# Patient Record
Sex: Male | Born: 1990 | Race: White | Hispanic: No | Marital: Single
Health system: Southern US, Community
[De-identification: ages and names within clinical notes are randomized; demographics above are authoritative.]

## PROBLEM LIST (undated history)

## (undated) DIAGNOSIS — C6290 Malignant neoplasm of unspecified testis, unspecified whether descended or undescended: Secondary | ICD-10-CM

## (undated) HISTORY — DX: Malignant neoplasm of unspecified testis, unspecified whether descended or undescended: C62.90

---
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
## 2008-03-03 ENCOUNTER — Ambulatory Visit (HOSPITAL_COMMUNITY): Admission: RE | Admit: 2008-03-03 | Discharge: 2008-03-03 | Payer: Self-pay | Admitting: Orthopedic Surgery

## 2009-03-06 ENCOUNTER — Ambulatory Visit (HOSPITAL_COMMUNITY): Admission: RE | Admit: 2009-03-06 | Discharge: 2009-03-06 | Payer: Self-pay | Admitting: Family Medicine

## 2009-03-13 ENCOUNTER — Encounter (INDEPENDENT_AMBULATORY_CARE_PROVIDER_SITE_OTHER): Payer: Self-pay | Admitting: Urology

## 2009-03-13 ENCOUNTER — Ambulatory Visit (HOSPITAL_BASED_OUTPATIENT_CLINIC_OR_DEPARTMENT_OTHER): Admission: RE | Admit: 2009-03-13 | Discharge: 2009-03-13 | Payer: Self-pay | Admitting: Urology

## 2009-03-16 ENCOUNTER — Emergency Department (HOSPITAL_BASED_OUTPATIENT_CLINIC_OR_DEPARTMENT_OTHER): Admission: EM | Admit: 2009-03-16 | Discharge: 2009-03-16 | Payer: Self-pay | Admitting: Emergency Medicine

## 2009-03-16 ENCOUNTER — Ambulatory Visit: Payer: Self-pay | Admitting: Hematology & Oncology

## 2009-03-21 ENCOUNTER — Ambulatory Visit (HOSPITAL_COMMUNITY): Admission: RE | Admit: 2009-03-21 | Discharge: 2009-03-21 | Payer: Self-pay | Admitting: Hematology & Oncology

## 2009-03-31 LAB — CBC WITH DIFFERENTIAL (CANCER CENTER ONLY)
BASO%: 0.7 % (ref 0.0–2.0)
HCT: 43.1 % (ref 38.7–49.9)
LYMPH#: 1.5 10*3/uL (ref 0.9–3.3)
MONO#: 0.3 10*3/uL (ref 0.1–0.9)
NEUT#: 3.1 10*3/uL (ref 1.5–6.5)
Platelets: 260 10*3/uL (ref 145–400)
RDW: 11.7 % (ref 10.5–14.6)
WBC: 5.1 10*3/uL (ref 4.0–10.0)

## 2009-04-02 LAB — COMPREHENSIVE METABOLIC PANEL
AST: 19 U/L (ref 0–37)
Albumin: 4.5 g/dL (ref 3.5–5.2)
Alkaline Phosphatase: 54 U/L (ref 39–117)
BUN: 11 mg/dL (ref 6–23)
Creatinine, Ser: 0.9 mg/dL (ref 0.40–1.50)
Potassium: 4.6 mEq/L (ref 3.5–5.3)
Total Bilirubin: 0.7 mg/dL (ref 0.3–1.2)

## 2009-05-02 ENCOUNTER — Ambulatory Visit: Payer: Self-pay | Admitting: Hematology & Oncology

## 2009-05-07 LAB — BETA HCG QUANT (REF LAB): Beta hCG, Tumor Marker: 62.1 m[IU]/mL — ABNORMAL HIGH (ref <5.0)

## 2009-05-10 ENCOUNTER — Ambulatory Visit: Payer: Self-pay | Admitting: Radiology

## 2009-05-10 ENCOUNTER — Ambulatory Visit (HOSPITAL_BASED_OUTPATIENT_CLINIC_OR_DEPARTMENT_OTHER): Admission: RE | Admit: 2009-05-10 | Discharge: 2009-05-10 | Payer: Self-pay | Admitting: Hematology & Oncology

## 2009-06-02 ENCOUNTER — Ambulatory Visit: Payer: Self-pay | Admitting: Hematology & Oncology

## 2009-06-05 LAB — CBC WITH DIFFERENTIAL (CANCER CENTER ONLY)
BASO%: 1.3 % (ref 0.0–2.0)
Eosinophils Absolute: 0.1 10*3/uL (ref 0.0–0.5)
LYMPH%: 16.9 % (ref 14.0–48.0)
MCH: 32.5 pg (ref 28.0–33.4)
MCV: 92 fL (ref 82–98)
MONO%: 9 % (ref 0.0–13.0)
Platelets: 156 10*3/uL (ref 145–400)
RDW: 12 % (ref 10.5–14.6)

## 2009-06-07 LAB — AFP TUMOR MARKER: AFP-Tumor Marker: 128.1 ng/mL — ABNORMAL HIGH (ref 0.0–8.0)

## 2009-06-07 LAB — COMPREHENSIVE METABOLIC PANEL
CO2: 24 mEq/L (ref 19–32)
Creatinine, Ser: 0.77 mg/dL (ref 0.40–1.50)
Glucose, Bld: 110 mg/dL — ABNORMAL HIGH (ref 70–99)
Total Bilirubin: 0.6 mg/dL (ref 0.3–1.2)

## 2009-06-07 LAB — BETA HCG QUANT (REF LAB): Beta hCG, Tumor Marker: 2.7 m[IU]/mL (ref <5.0)

## 2009-06-26 LAB — CBC WITH DIFFERENTIAL (CANCER CENTER ONLY)
BASO%: 1.2 % (ref 0.0–2.0)
EOS%: 0.9 % (ref 0.0–7.0)
LYMPH%: 24.6 % (ref 14.0–48.0)
MCV: 92 fL (ref 82–98)
MONO#: 0.8 10*3/uL (ref 0.1–0.9)
Platelets: 153 10*3/uL (ref 145–400)
RDW: 12.7 % (ref 10.5–14.6)
WBC: 10.5 10*3/uL — ABNORMAL HIGH (ref 4.0–10.0)

## 2009-06-28 LAB — COMPREHENSIVE METABOLIC PANEL
ALT: 30 U/L (ref 0–53)
AST: 20 U/L (ref 0–37)
Alkaline Phosphatase: 65 U/L (ref 39–117)
BUN: 11 mg/dL (ref 6–23)
Calcium: 9.6 mg/dL (ref 8.4–10.5)
Chloride: 107 mEq/L (ref 96–112)
Creatinine, Ser: 0.74 mg/dL (ref 0.40–1.50)
Total Bilirubin: 0.5 mg/dL (ref 0.3–1.2)

## 2009-06-28 LAB — BETA HCG QUANT (REF LAB): Beta hCG, Tumor Marker: <0.5 m[IU]/mL (ref <5.0)

## 2009-06-28 LAB — AFP TUMOR MARKER: AFP-Tumor Marker: 8.2 ng/mL — ABNORMAL HIGH (ref 0.0–8.0)

## 2009-06-28 LAB — MAGNESIUM: Magnesium: 1.8 mg/dL (ref 1.5–2.5)

## 2009-07-14 ENCOUNTER — Ambulatory Visit: Payer: Self-pay | Admitting: Hematology & Oncology

## 2009-07-17 LAB — COMPREHENSIVE METABOLIC PANEL
ALT: 21 U/L (ref 0–53)
AST: 21 U/L (ref 0–37)
Alkaline Phosphatase: 59 U/L (ref 39–117)
Chloride: 106 mEq/L (ref 96–112)
Creatinine, Ser: 0.78 mg/dL (ref 0.40–1.50)
Total Bilirubin: 0.5 mg/dL (ref 0.3–1.2)

## 2009-07-17 LAB — CBC WITH DIFFERENTIAL (CANCER CENTER ONLY)
BASO#: 0 10*3/uL (ref 0.0–0.2)
Eosinophils Absolute: 0.1 10*3/uL (ref 0.0–0.5)
HGB: 13.3 g/dL (ref 13.0–17.1)
LYMPH%: 30.4 % (ref 14.0–48.0)
MCH: 32.6 pg (ref 28.0–33.4)
MCV: 93 fL (ref 82–98)
MONO%: 7.5 % (ref 0.0–13.0)
NEUT%: 60.6 % (ref 40.0–80.0)
RBC: 4.07 10*6/uL — ABNORMAL LOW (ref 4.20–5.70)

## 2009-07-17 LAB — LACTATE DEHYDROGENASE: LDH: 193 U/L (ref 94–250)

## 2009-07-19 LAB — AFP TUMOR MARKER: AFP-Tumor Marker: 2 ng/mL (ref 0.0–8.0)

## 2009-07-19 LAB — BETA HCG QUANT (REF LAB): Beta hCG, Tumor Marker: <0.5 m[IU]/mL

## 2009-08-04 ENCOUNTER — Ambulatory Visit (HOSPITAL_BASED_OUTPATIENT_CLINIC_OR_DEPARTMENT_OTHER): Admission: RE | Admit: 2009-08-04 | Discharge: 2009-08-04 | Payer: Self-pay | Admitting: Hematology & Oncology

## 2009-08-04 ENCOUNTER — Ambulatory Visit: Payer: Self-pay | Admitting: Diagnostic Radiology

## 2009-08-21 ENCOUNTER — Ambulatory Visit: Payer: Self-pay | Admitting: Hematology & Oncology

## 2009-08-30 LAB — CBC WITH DIFFERENTIAL (CANCER CENTER ONLY)
Eosinophils Absolute: 0.1 10*3/uL (ref 0.0–0.5)
LYMPH#: 1.3 10*3/uL (ref 0.9–3.3)
LYMPH%: 33.8 % (ref 14.0–48.0)
MCV: 99 fL — ABNORMAL HIGH (ref 82–98)
MONO#: 0.2 10*3/uL (ref 0.1–0.9)
Platelets: 136 10*3/uL — ABNORMAL LOW (ref 145–400)
RBC: 4.17 10*6/uL — ABNORMAL LOW (ref 4.20–5.70)
WBC: 3.9 10*3/uL — ABNORMAL LOW (ref 4.0–10.0)

## 2009-09-04 LAB — COMPREHENSIVE METABOLIC PANEL
Albumin: 4.4 g/dL (ref 3.5–5.2)
CO2: 27 mEq/L (ref 19–32)
Calcium: 9.4 mg/dL (ref 8.4–10.5)
Chloride: 104 mEq/L (ref 96–112)
Glucose, Bld: 116 mg/dL — ABNORMAL HIGH (ref 70–99)
Potassium: 4.1 mEq/L (ref 3.5–5.3)
Sodium: 141 mEq/L (ref 135–145)
Total Bilirubin: 0.9 mg/dL (ref 0.3–1.2)
Total Protein: 6.6 g/dL (ref 6.0–8.3)

## 2009-09-04 LAB — AFP TUMOR MARKER: AFP-Tumor Marker: 1.5 ng/mL (ref 0.0–8.0)

## 2009-09-04 LAB — LACTATE DEHYDROGENASE: LDH: 125 U/L (ref 94–250)

## 2009-09-22 ENCOUNTER — Ambulatory Visit: Payer: Self-pay | Admitting: Diagnostic Radiology

## 2009-09-22 ENCOUNTER — Emergency Department (HOSPITAL_BASED_OUTPATIENT_CLINIC_OR_DEPARTMENT_OTHER): Admission: EM | Admit: 2009-09-22 | Discharge: 2009-09-22 | Payer: Self-pay | Admitting: Emergency Medicine

## 2009-10-30 ENCOUNTER — Ambulatory Visit: Payer: Self-pay | Admitting: Hematology & Oncology

## 2009-11-01 LAB — CBC WITH DIFFERENTIAL (CANCER CENTER ONLY)
BASO%: 0.6 % (ref 0.0–2.0)
EOS%: 4.2 % (ref 0.0–7.0)
HCT: 44.5 % (ref 38.7–49.9)
LYMPH%: 40.4 % (ref 14.0–48.0)
MCHC: 33 g/dL (ref 32.0–35.9)
MCV: 92 fL (ref 82–98)
NEUT%: 47.2 % (ref 40.0–80.0)
RDW: 11.5 % (ref 10.5–14.6)

## 2009-11-04 LAB — LACTATE DEHYDROGENASE: LDH: 162 U/L (ref 94–250)

## 2009-11-04 LAB — COMPREHENSIVE METABOLIC PANEL
ALT: 24 U/L (ref 0–53)
AST: 18 U/L (ref 0–37)
Creatinine, Ser: 0.74 mg/dL (ref 0.40–1.50)
Total Bilirubin: 0.7 mg/dL (ref 0.3–1.2)

## 2009-11-08 ENCOUNTER — Emergency Department (HOSPITAL_BASED_OUTPATIENT_CLINIC_OR_DEPARTMENT_OTHER): Admission: EM | Admit: 2009-11-08 | Discharge: 2009-11-08 | Payer: Self-pay | Admitting: Emergency Medicine

## 2010-01-08 ENCOUNTER — Ambulatory Visit: Payer: Self-pay | Admitting: Hematology & Oncology

## 2010-01-31 ENCOUNTER — Ambulatory Visit (HOSPITAL_BASED_OUTPATIENT_CLINIC_OR_DEPARTMENT_OTHER): Admission: RE | Admit: 2010-01-31 | Discharge: 2010-01-31 | Payer: Self-pay | Admitting: Hematology & Oncology

## 2010-01-31 ENCOUNTER — Ambulatory Visit: Payer: Self-pay | Admitting: Diagnostic Radiology

## 2010-01-31 LAB — CMP (CANCER CENTER ONLY)
ALT(SGPT): 20 U/L (ref 10–47)
AST: 20 U/L (ref 11–38)
Calcium: 9.8 mg/dL (ref 8.0–10.3)
Chloride: 98 mEq/L (ref 98–108)
Creat: 0.7 mg/dl (ref 0.6–1.2)
Sodium: 137 mEq/L (ref 128–145)

## 2010-01-31 LAB — CBC WITH DIFFERENTIAL (CANCER CENTER ONLY)
BASO%: 0.8 % (ref 0.0–2.0)
EOS%: 2.8 % (ref 0.0–7.0)
HCT: 46.3 % (ref 38.7–49.9)
LYMPH#: 1.2 10*3/uL (ref 0.9–3.3)
LYMPH%: 33 % (ref 14.0–48.0)
MCHC: 34.1 g/dL (ref 32.0–35.9)
MCV: 92 fL (ref 82–98)
NEUT%: 57.4 % (ref 40.0–80.0)
RDW: 12.3 % (ref 10.5–14.6)

## 2010-02-07 ENCOUNTER — Ambulatory Visit: Payer: Self-pay | Admitting: Hematology & Oncology

## 2010-05-02 ENCOUNTER — Ambulatory Visit: Payer: Self-pay | Admitting: Hematology & Oncology

## 2010-05-04 ENCOUNTER — Ambulatory Visit (HOSPITAL_BASED_OUTPATIENT_CLINIC_OR_DEPARTMENT_OTHER)
Admission: RE | Admit: 2010-05-04 | Discharge: 2010-05-04 | Payer: Self-pay | Source: Home / Self Care | Admitting: Hematology & Oncology

## 2010-05-04 ENCOUNTER — Ambulatory Visit: Payer: Self-pay | Admitting: Diagnostic Radiology

## 2010-05-04 LAB — CMP (CANCER CENTER ONLY)
AST: 27 U/L (ref 11–38)
Albumin: 4.5 g/dL (ref 3.3–5.5)
BUN, Bld: 12 mg/dL (ref 7–22)
CO2: 31 mEq/L (ref 18–33)
Calcium: 9.5 mg/dL (ref 8.0–10.3)
Chloride: 100 mEq/L (ref 98–108)
Glucose, Bld: 101 mg/dL (ref 73–118)
Potassium: 4.9 mEq/L — ABNORMAL HIGH (ref 3.3–4.7)

## 2010-05-04 LAB — CBC WITH DIFFERENTIAL (CANCER CENTER ONLY)
BASO#: 0 10*3/uL (ref 0.0–0.2)
Eosinophils Absolute: 0.1 10*3/uL (ref 0.0–0.5)
HGB: 15.5 g/dL (ref 13.0–17.1)
LYMPH%: 36.4 % (ref 14.0–48.0)
MCH: 31.6 pg (ref 28.0–33.4)
MCHC: 33.6 g/dL (ref 32.0–35.9)
MCV: 94 fL (ref 82–98)
MONO%: 6.7 % (ref 0.0–13.0)
NEUT%: 53.6 % (ref 40.0–80.0)
RBC: 4.92 10*6/uL (ref 4.20–5.70)

## 2010-05-07 LAB — BETA HCG QUANT (REF LAB): Beta hCG, Tumor Marker: <0.5 m[IU]/mL (ref <5.0)

## 2010-06-14 ENCOUNTER — Ambulatory Visit: Payer: Self-pay | Admitting: Hematology & Oncology

## 2010-07-31 ENCOUNTER — Ambulatory Visit: Payer: Self-pay | Admitting: Hematology & Oncology

## 2010-08-02 ENCOUNTER — Ambulatory Visit (HOSPITAL_BASED_OUTPATIENT_CLINIC_OR_DEPARTMENT_OTHER)
Admission: RE | Admit: 2010-08-02 | Discharge: 2010-08-02 | Payer: Self-pay | Source: Home / Self Care | Attending: Hematology & Oncology | Admitting: Hematology & Oncology

## 2010-08-02 LAB — CBC WITH DIFFERENTIAL (CANCER CENTER ONLY)
BASO#: 0 10*3/uL (ref 0.0–0.2)
BASO%: 0.9 % (ref 0.0–2.0)
EOS%: 2.3 % (ref 0.0–7.0)
Eosinophils Absolute: 0.1 10*3/uL (ref 0.0–0.5)
HCT: 46.7 % (ref 38.7–49.9)
HGB: 15.9 g/dL (ref 13.0–17.1)
LYMPH#: 1.2 10*3/uL (ref 0.9–3.3)
LYMPH%: 30.8 % (ref 14.0–48.0)
MCH: 31.8 pg (ref 28.0–33.4)
MCHC: 34.1 g/dL (ref 32.0–35.9)
MCV: 93 fL (ref 82–98)
MONO#: 0.3 10*3/uL (ref 0.1–0.9)
MONO%: 6.9 % (ref 0.0–13.0)
NEUT#: 2.3 10*3/uL (ref 1.5–6.5)
NEUT%: 59.1 % (ref 40.0–80.0)
Platelets: 178 10*3/uL (ref 145–400)
RBC: 5 10*6/uL (ref 4.20–5.70)
RDW: 11.9 % (ref 10.5–14.6)
WBC: 3.9 10*3/uL — ABNORMAL LOW (ref 4.0–10.0)

## 2010-08-06 LAB — COMPREHENSIVE METABOLIC PANEL
ALT: 19 U/L (ref 0–53)
AST: 16 U/L (ref 0–37)
Albumin: 4.6 g/dL (ref 3.5–5.2)
Alkaline Phosphatase: 42 U/L (ref 39–117)
BUN: 12 mg/dL (ref 6–23)
CO2: 26 mEq/L (ref 19–32)
Calcium: 9.2 mg/dL (ref 8.4–10.5)
Chloride: 105 mEq/L (ref 96–112)
Creatinine, Ser: 0.76 mg/dL (ref 0.40–1.50)
Glucose, Bld: 83 mg/dL (ref 70–99)
Potassium: 4.3 mEq/L (ref 3.5–5.3)
Sodium: 139 mEq/L (ref 135–145)
Total Bilirubin: 1 mg/dL (ref 0.3–1.2)
Total Protein: 6.6 g/dL (ref 6.0–8.3)

## 2010-08-06 LAB — BETA HCG QUANT (REF LAB): Beta hCG, Tumor Marker: <0.5 m[IU]/mL (ref <5.0)

## 2010-08-06 LAB — LACTATE DEHYDROGENASE: LDH: 122 U/L (ref 94–250)

## 2010-08-06 LAB — AFP TUMOR MARKER: AFP-Tumor Marker: 2.6 ng/mL (ref 0.0–8.0)

## 2010-08-19 ENCOUNTER — Encounter: Payer: Self-pay | Admitting: Hematology & Oncology

## 2010-08-20 ENCOUNTER — Encounter: Payer: Self-pay | Admitting: Orthopedic Surgery

## 2010-10-17 LAB — URINALYSIS, ROUTINE W REFLEX MICROSCOPIC
Bilirubin Urine: NEGATIVE
Glucose, UA: NEGATIVE mg/dL
Hgb urine dipstick: NEGATIVE
Nitrite: NEGATIVE
Nitrite: NEGATIVE
Specific Gravity, Urine: 1.025 (ref 1.005–1.030)
Specific Gravity, Urine: 1.011 (ref 1.005–1.030)
Urobilinogen, UA: 0.2 mg/dL (ref 0.0–1.0)
pH: 5.5 (ref 5.0–8.0)
pH: 6.5 (ref 5.0–8.0)

## 2010-10-17 LAB — CBC
Hemoglobin: 14.4 g/dL (ref 13.0–17.0)
MCHC: 34 g/dL (ref 30.0–36.0)
MCV: 91 fL (ref 78.0–100.0)
Platelets: 143 10*3/uL — ABNORMAL LOW (ref 150–400)
RBC: 4.41 MIL/uL (ref 4.22–5.81)
RDW: 12.1 % (ref 11.5–15.5)

## 2010-10-17 LAB — DIFFERENTIAL
Eosinophils Relative: 7 % — ABNORMAL HIGH (ref 0–5)
Lymphocytes Relative: 40 % (ref 12–46)
Lymphocytes Relative: 17 % (ref 12–46)
Lymphs Abs: 1.4 10*3/uL (ref 0.7–4.0)
Lymphs Abs: 0.8 10*3/uL (ref 0.7–4.0)
Monocytes Absolute: 0.3 10*3/uL (ref 0.1–1.0)
Monocytes Absolute: 0.3 10*3/uL (ref 0.1–1.0)
Monocytes Relative: 9 % (ref 3–12)
Monocytes Relative: 7 % (ref 3–12)
Neutro Abs: 3.5 10*3/uL (ref 1.7–7.7)

## 2010-10-17 LAB — LIPASE, BLOOD: Lipase: 65 U/L (ref 23–300)

## 2010-10-17 LAB — BASIC METABOLIC PANEL
Calcium: 8.6 mg/dL (ref 8.4–10.5)
GFR calc Af Amer: >60 mL/min (ref >60)
GFR calc non Af Amer: >60 mL/min (ref >60)
Potassium: 4.2 mEq/L (ref 3.5–5.1)
Sodium: 139 mEq/L (ref 135–145)

## 2010-10-17 LAB — COMPREHENSIVE METABOLIC PANEL
ALT: 23 U/L (ref 0–53)
AST: 25 U/L (ref 0–37)
Albumin: 4 g/dL (ref 3.5–5.2)
Calcium: 9.4 mg/dL (ref 8.4–10.5)
Creatinine, Ser: 0.8 mg/dL (ref 0.4–1.5)
GFR calc Af Amer: >60 mL/min (ref >60)
GFR calc non Af Amer: >60 mL/min (ref >60)
Sodium: 142 mEq/L (ref 135–145)
Total Protein: 7 g/dL (ref 6.0–8.3)

## 2010-11-03 LAB — POCT HEMOGLOBIN-HEMACUE: Hemoglobin: 17.7 g/dL — ABNORMAL HIGH (ref 13.0–17.0)

## 2010-11-27 ENCOUNTER — Other Ambulatory Visit: Payer: Self-pay | Admitting: Hematology & Oncology

## 2010-11-27 ENCOUNTER — Ambulatory Visit (HOSPITAL_BASED_OUTPATIENT_CLINIC_OR_DEPARTMENT_OTHER)
Admission: RE | Admit: 2010-11-27 | Discharge: 2010-11-27 | Disposition: A | Discharge status: Home / Self Care | Payer: 59 | Source: Ambulatory Visit | Attending: Hematology & Oncology | Admitting: Hematology & Oncology

## 2010-11-27 DIAGNOSIS — K8020 Calculus of gallbladder without cholecystitis without obstruction: Secondary | ICD-10-CM | POA: Insufficient documentation

## 2010-11-27 DIAGNOSIS — C6290 Malignant neoplasm of unspecified testis, unspecified whether descended or undescended: Secondary | ICD-10-CM | POA: Insufficient documentation

## 2010-11-27 DIAGNOSIS — Z79899 Other long term (current) drug therapy: Secondary | ICD-10-CM | POA: Insufficient documentation

## 2010-11-27 MED ORDER — IOHEXOL 300 MG/ML  SOLN
100.0000 mL | Freq: Once | INTRAMUSCULAR | Status: AC | PRN
  Administered 2010-11-27: 100 mL via INTRAVENOUS

## 2010-12-11 NOTE — Op Note (Signed)
NAMEMAMIE, DIIORIO                  ACCOUNT NO.:  [id number]   MEDICAL RECORD NO.:  [id number]          PATIENT TYPE:  AMB   LOCATION:  [facility]                         FACILITY:  [facility]   PHYSICIAN:  [name], M.D.  DATE OF BIRTH:  May 18, 1991   DATE OF PROCEDURE:  DATE OF DISCHARGE:                               OPERATIVE REPORT   PREOPERATIVE DIAGNOSIS:  Left testicular neoplasm.   POSTOPERATIVE DIAGNOSIS:  Left testicular neoplasm.   PROCEDURE:  1. Left inguinal orchiectomy.  2. Left testicular prosthesis placement.   SURGEON:  [name], M.D.   ANESTHESIA:  General.   SPECIMENS:  Left testicle and spermatic cord to pathology.   TESTICULAR PROSTHESIS:  2.9 x 4.5 cm x 20 mL saline filled testicular  prosthesis.   BLOOD LOSS:  Minimal.   DRAINS:  None.   COMPLICATIONS:  None.   INDICATIONS:  The patient is a 20 year old male who found a firm mass in  the lower pole of his left testicle on examination.  He was seen and  evaluated with a scrotal ultrasound revealing an inhomogeneous, solid  mass in the left testicle.  His alpha fetoprotein was elevated to 54.2,  HCG elevated to 121, testosterone normal at 1,290, estradiol elevated to  105 and LDH normal.  We discussed the fact that this appears to be a  testicular malignancy and the procedure recommended was left radical  orchiectomy.  I have gone over the procedure as well as its risks and  complications and offered testicular prosthesis which he did want to  proceed with at that time.   DESCRIPTION OF OPERATION:  After informed consent, the patient was  brought to the major OR, placed on the table, administered general  anesthesia and then his genitalia and lower abdomen were sterilely  prepped and draped.  An official time-out was then performed.   Examination of the scrotum revealed a mass in lower pole left testicle.  I then made an incision over the left inguinal canal following lines of  Laurence Ferrari and  carried this down through the subcutaneous tissue to expose  the external oblique fascia.  I then incised along the fibers of the  fascia down to and through the external inguinal ring.  I then  identified the ileal inguinal nerve and dissected this free and isolated  this to prevent injury.  The spermatic cord was then bluntly dissected  from the inguinal canal and a Penrose drain was placed around this and  looped.  This was placed on traction and a hemostat was applied to  occlude the blood supply.  I then dissected the cord proximally to the  level of the internal inguinal ring and then placed a Kelly clamp  through the middle of the cord and isolated the cord into two equal  packages which were clamped with Kelly clamps and divided distally.  I  then doubly ligated both of these vascular packages first with a 2-0  Vicryl suture followed by a 2-0 silk suture.  The silk suture was  allowed to remain long on both of  these segments and they were placed  back into the area of the retroperitoneum through the internal inguinal  ring.  I then inspected the inguinal canal and noted no bleeding.  Attention was then directed to the scrotum.   The testicle was then delivered by inverting the scrotum and the  testicle was then brought into the incision where the gubernaculum was  identified and divided with electrocautery.  I then irrigated the wound  copiously with antibiotic solution.  The specimen was passed off and  sent to pathology.   I then placed a Babcock at the most dependent portion of the scrotum and  everted the scrotum and placed a single 3-0 silk suture in a figure-of-  eight fashion at that location.  I had measured the testicle that was  removed and it measured 4.5 cm in length by approximately by  approximately 3 cm in width.  I therefore chose a large testicular  prosthesis measuring 4.5 cm in length and two-point 9 cm in width and  filled it with injectable saline until it  was identical to the firmness  of the testicle that was removed.  This was soaked in antibiotic  solution.  I then secured to the dependent scrotal lining by placing the  previously placed silk suture through the eye of the prosthesis and  tying this down.  The testicle was noted to lie in good anatomic  position.  I therefore irrigated again with antibiotic solution.   I then closed the external oblique fascia with care being taken to  visualize the ilioinguinal nerve throughout the closure and maintained  this away from the closure.  I then injected approximately 20 mL of half  percent Marcaine with epinephrine in the subcutaneous tissue and in the  area the inguinal canal.  Scarpa fascia was reapproximated with running  3-0 chromic suture and the skin edges were reapproximated with a  subcuticular 4-0 Monocryl suture.  A sterile occlusive dressing was  applied to the inguinal incision and fluffed Kerlix and a scrotal  support to the scrotum and the patient was awakened and taken to  recovery room in stable and satisfactory condition.  He tolerated the  procedure well and there were no intraoperative complications.   He will be given a prescription for Vicodin HP #36 and remain on  doxycycline 100 mg b.i.d. for 7 days and return to my office for postop  check and to discuss the pathology report at that time.      [name], M.D.  Electronically Signed     MCO/MEDQ  D:  03/13/2009  T:  03/13/2009  Job:  [id number]

## 2010-12-25 ENCOUNTER — Other Ambulatory Visit: Payer: Self-pay | Admitting: Hematology & Oncology

## 2010-12-25 ENCOUNTER — Encounter (HOSPITAL_BASED_OUTPATIENT_CLINIC_OR_DEPARTMENT_OTHER): Payer: 59 | Admitting: Hematology & Oncology

## 2010-12-25 DIAGNOSIS — C6290 Malignant neoplasm of unspecified testis, unspecified whether descended or undescended: Secondary | ICD-10-CM

## 2010-12-25 LAB — CBC WITH DIFFERENTIAL (CANCER CENTER ONLY)
BASO#: 0 10*3/uL (ref 0.0–0.2)
Eosinophils Absolute: 0.1 10*3/uL (ref 0.0–0.5)
HCT: 43.4 % (ref 38.7–49.9)
HGB: 15.9 g/dL (ref 13.0–17.1)
LYMPH%: 30.6 % (ref 14.0–48.0)
MCH: 31.9 pg (ref 28.0–33.4)
MCV: 87 fL (ref 82–98)
MONO%: 9.6 % (ref 0.0–13.0)
NEUT#: 2.6 10*3/uL (ref 1.5–6.5)
RBC: 4.99 10*6/uL (ref 4.20–5.70)

## 2010-12-27 LAB — AFP TUMOR MARKER: AFP-Tumor Marker: 1.8 ng/mL (ref 0.0–8.0)

## 2010-12-27 LAB — BETA HCG QUANT (REF LAB): Beta hCG, Tumor Marker: <0.5 m[IU]/mL (ref <5.0)

## 2010-12-27 LAB — COMPREHENSIVE METABOLIC PANEL
CO2: 28 mEq/L (ref 19–32)
Glucose, Bld: 87 mg/dL (ref 70–99)
Sodium: 138 mEq/L (ref 135–145)
Total Bilirubin: 0.7 mg/dL (ref 0.3–1.2)
Total Protein: 6.9 g/dL (ref 6.0–8.3)

## 2011-04-30 ENCOUNTER — Other Ambulatory Visit: Payer: Self-pay | Admitting: Hematology & Oncology

## 2011-04-30 ENCOUNTER — Encounter (HOSPITAL_BASED_OUTPATIENT_CLINIC_OR_DEPARTMENT_OTHER): Payer: 59 | Admitting: Hematology & Oncology

## 2011-04-30 ENCOUNTER — Ambulatory Visit (INDEPENDENT_AMBULATORY_CARE_PROVIDER_SITE_OTHER)
Admission: RE | Admit: 2011-04-30 | Discharge: 2011-04-30 | Disposition: A | Discharge status: Home / Self Care | Payer: 59 | Source: Ambulatory Visit | Attending: Hematology & Oncology | Admitting: Hematology & Oncology

## 2011-04-30 ENCOUNTER — Ambulatory Visit (HOSPITAL_BASED_OUTPATIENT_CLINIC_OR_DEPARTMENT_OTHER)
Admission: RE | Admit: 2011-04-30 | Discharge: 2011-04-30 | Disposition: A | Discharge status: Home / Self Care | Payer: 59 | Source: Ambulatory Visit | Attending: Hematology & Oncology | Admitting: Hematology & Oncology

## 2011-04-30 DIAGNOSIS — C6290 Malignant neoplasm of unspecified testis, unspecified whether descended or undescended: Secondary | ICD-10-CM

## 2011-04-30 DIAGNOSIS — K8020 Calculus of gallbladder without cholecystitis without obstruction: Secondary | ICD-10-CM | POA: Insufficient documentation

## 2011-04-30 LAB — CBC WITH DIFFERENTIAL (CANCER CENTER ONLY)
BASO#: 0 10*3/uL (ref 0.0–0.2)
HCT: 45.3 % (ref 38.7–49.9)
HGB: 16.5 g/dL (ref 13.0–17.1)
LYMPH#: 1.8 10*3/uL (ref 0.9–3.3)
MONO#: 0.5 10*3/uL (ref 0.1–0.9)
NEUT#: 2.5 10*3/uL (ref 1.5–6.5)
NEUT%: 51.2 % (ref 40.0–80.0)
RBC: 5.06 10*6/uL (ref 4.20–5.70)
WBC: 4.8 10*3/uL (ref 4.0–10.0)

## 2011-04-30 MED ORDER — IOHEXOL 300 MG/ML  SOLN
100.0000 mL | Freq: Once | INTRAMUSCULAR | Status: AC | PRN
  Administered 2011-04-30: 100 mL via INTRAVENOUS

## 2011-05-03 LAB — COMPREHENSIVE METABOLIC PANEL
Alkaline Phosphatase: 48 U/L (ref 39–117)
BUN: 11 mg/dL (ref 6–23)
CO2: 27 mEq/L (ref 19–32)
Creatinine, Ser: 0.81 mg/dL (ref 0.50–1.35)
Glucose, Bld: 81 mg/dL (ref 70–99)
Total Bilirubin: 1.1 mg/dL (ref 0.3–1.2)
Total Protein: 6.9 g/dL (ref 6.0–8.3)

## 2011-05-03 LAB — AFP TUMOR MARKER: AFP-Tumor Marker: 1.9 ng/mL (ref 0.0–8.0)

## 2011-05-03 LAB — BETA HCG QUANT (REF LAB): Beta hCG, Tumor Marker: <0.5 m[IU]/mL (ref <5.0)

## 2011-07-11 ENCOUNTER — Telehealth: Payer: Self-pay | Admitting: *Deleted

## 2011-07-11 ENCOUNTER — Other Ambulatory Visit: Payer: 59 | Admitting: Lab

## 2011-07-11 ENCOUNTER — Ambulatory Visit: Payer: 59 | Admitting: Hematology & Oncology

## 2011-07-11 NOTE — Telephone Encounter (Signed)
Pt missed 12-13 is coming 12-26 instead

## 2011-07-24 ENCOUNTER — Encounter: Payer: Self-pay | Admitting: Hematology & Oncology

## 2011-07-24 ENCOUNTER — Other Ambulatory Visit: Payer: Self-pay | Admitting: Hematology & Oncology

## 2011-07-24 ENCOUNTER — Ambulatory Visit (HOSPITAL_BASED_OUTPATIENT_CLINIC_OR_DEPARTMENT_OTHER): Payer: 59 | Admitting: Hematology & Oncology

## 2011-07-24 ENCOUNTER — Other Ambulatory Visit: Payer: 59 | Admitting: Lab

## 2011-07-24 VITALS — BP 139/72 | HR 57 | Temp 98.5°F | Ht 75.0 in | Wt 202.0 lb

## 2011-07-24 DIAGNOSIS — C6290 Malignant neoplasm of unspecified testis, unspecified whether descended or undescended: Secondary | ICD-10-CM | POA: Insufficient documentation

## 2011-07-24 HISTORY — DX: Malignant neoplasm of unspecified testis, unspecified whether descended or undescended: C62.90

## 2011-07-24 LAB — CBC WITH DIFFERENTIAL (CANCER CENTER ONLY)
BASO#: 0 10*3/uL (ref 0.0–0.2)
EOS%: 1.5 % (ref 0.0–7.0)
HGB: 17.1 g/dL (ref 13.0–17.1)
LYMPH#: 1.7 10*3/uL (ref 0.9–3.3)
MCHC: 36.2 g/dL — ABNORMAL HIGH (ref 32.0–35.9)
MONO#: 0.4 10*3/uL (ref 0.1–0.9)
NEUT#: 3.1 10*3/uL (ref 1.5–6.5)
RBC: 5.32 10*6/uL (ref 4.20–5.70)
WBC: 5.3 10*3/uL (ref 4.0–10.0)

## 2011-07-24 LAB — CMP (CANCER CENTER ONLY)
ALT(SGPT): 29 U/L (ref 10–47)
AST: 20 U/L (ref 11–38)
Albumin: 4.1 g/dL (ref 3.3–5.5)
BUN, Bld: 11 mg/dL (ref 7–22)
CO2: 31 mEq/L (ref 18–33)
Calcium: 9.4 mg/dL (ref 8.0–10.3)
Chloride: 102 mEq/L (ref 98–108)
Potassium: 4.4 mEq/L (ref 3.3–4.7)

## 2011-07-24 NOTE — Progress Notes (Signed)
This office note has been dictated.

## 2011-07-25 NOTE — Progress Notes (Signed)
CC:   [name], M.D.  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  CURRENT THERAPY:  Observation.  INTERIM HISTORY:  Mr. Geer comes in for followup.  We last saw back in May.  Since then, he has done [employer] down in [place] and [place].  He is in [place].  He is basically doing Acupuncturist. This was an incredibly good time for him.  It was very tough on him but he graduated with distinction.  There is no surprise. He is going to get commissioned next year.  He is really looking forward to this.  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  As far as his cancer is concerned, is doing real well.  His last scans were done back in May.  His scans looked fantastic.  There is no evidence of recurrent disease.  His last tumor markers done back in May showed alpha-fetoprotein that was 1.8.  His beta hCG was less than 0.5. His LDH was 134.  He has noted some slight enlargement of the right testicle.  I do not think this is abnormal.  However, I told that if the swelling does continue, he probably needs to go see Dr. Vernie Ammons.  He has had no abdominal pain.  His appetite has been great.  He lost about 12 pounds while on training this summer.  He has gained this back. He has had no cough.  There has been no bony pain.  He has had no leg swelling.  There have been no rashes.  PHYSICAL EXAM:  General:  This is a well-developed, well-nourished, white gentleman in no obvious distress.  Vital Signs:  Temperature of 98.5, pulse 57, respiratory rate 20, blood pressure 139/72.  Weight is 202.  Head and Neck Exam:  Normocephalic, atraumatic skull.  There are no ocular or oral lesions.  There are no palpable cervical or supraclavicular lymph nodes.  Lungs:  Clear to percussion and auscultation bilaterally.  Cardiac Exam:  Regular rate and rhythm with normal S1 and S2.  There are no murmurs, rubs, or bruits.  Abdominal Exam:  Soft abdomen.  Good bowel  sounds.  He has a well-healed laparotomy wound from his retroperitoneal lymph node dissection.  He may have a little bit of a keloid with his abdominal wound.  There is no abdominal mass.  There is no fluid wave.  There is no palpable hepatosplenomegaly.  Back Exam:  No tenderness over the spine, ribs, or hips.  Extremities:  No clubbing, cyanosis, or edema.  He has good range of motion of his joints.  Neurological Exam:  No focal neurological deficits.  Skin Exam:  No rash, ecchymosis, or petechia noted.  LABORATORY STUDIES:  White count 5.3, hemoglobin 17, hematocrit 47, platelet count is 194.  IMPRESSION:  Mr. Renfrew is a 20 year old gentleman with recurrent nonseminomatous germ cell tumor.  He underwent orchiectomy.  He did have recurrence that required systemic chemotherapy.  He had 4 cycles of cisplatin/etoposide.  I elected to hold on the bleomycin because this is a high level of athletic ability and I did not want to compromise his pulmonary function.  He completed chemotherapy in December 2010.  He then underwent retroperitoneal lymph node dissection at [facility] in February 2011.  His dissection results came back clean.  For now, we will plan for a followup CT scan in April.  I feel that we can wait until then.  Again, the tumor markers have all looked good.  We will see if they are this time.  I am happy that he is doing great.  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    ______________________________ Josph Macho, M.D. PRE/MEDQ  D:  07/24/2011  T:  07/24/2011  Job:  [id number]  ADDENDUM:  AFP is 1.8.  b-HCG is < 0.5.  LDH is 138.

## 2011-10-18 ENCOUNTER — Other Ambulatory Visit: Payer: Self-pay

## 2011-11-21 ENCOUNTER — Telehealth: Payer: Self-pay | Admitting: Hematology & Oncology

## 2011-11-21 NOTE — Telephone Encounter (Signed)
Pt called and cx 11/25/11 apt and resch for 11/27/11

## 2011-11-22 ENCOUNTER — Other Ambulatory Visit (HOSPITAL_BASED_OUTPATIENT_CLINIC_OR_DEPARTMENT_OTHER): Payer: 59

## 2011-11-25 ENCOUNTER — Other Ambulatory Visit: Payer: 59 | Admitting: Lab

## 2011-11-27 ENCOUNTER — Other Ambulatory Visit (HOSPITAL_BASED_OUTPATIENT_CLINIC_OR_DEPARTMENT_OTHER): Payer: 59 | Admitting: Lab

## 2011-11-27 ENCOUNTER — Telehealth: Payer: Self-pay | Admitting: Hematology & Oncology

## 2011-11-27 ENCOUNTER — Ambulatory Visit (INDEPENDENT_AMBULATORY_CARE_PROVIDER_SITE_OTHER)
Admission: RE | Admit: 2011-11-27 | Discharge: 2011-11-27 | Disposition: A | Discharge status: Home / Self Care | Payer: 59 | Source: Ambulatory Visit | Attending: Hematology & Oncology | Admitting: Hematology & Oncology

## 2011-11-27 ENCOUNTER — Ambulatory Visit (HOSPITAL_BASED_OUTPATIENT_CLINIC_OR_DEPARTMENT_OTHER)
Admission: RE | Admit: 2011-11-27 | Discharge: 2011-11-27 | Disposition: A | Discharge status: Home / Self Care | Payer: 59 | Source: Ambulatory Visit | Attending: Hematology & Oncology | Admitting: Hematology & Oncology

## 2011-11-27 DIAGNOSIS — Z9079 Acquired absence of other genital organ(s): Secondary | ICD-10-CM

## 2011-11-27 DIAGNOSIS — C6290 Malignant neoplasm of unspecified testis, unspecified whether descended or undescended: Secondary | ICD-10-CM | POA: Insufficient documentation

## 2011-11-27 DIAGNOSIS — K8020 Calculus of gallbladder without cholecystitis without obstruction: Secondary | ICD-10-CM | POA: Insufficient documentation

## 2011-11-27 LAB — CBC WITH DIFFERENTIAL (CANCER CENTER ONLY)
BASO%: 0.2 % (ref 0.0–2.0)
EOS%: 1.4 % (ref 0.0–7.0)
HCT: 46 % (ref 38.7–49.9)
LYMPH%: 26.9 % (ref 14.0–48.0)
MCHC: 35.9 g/dL (ref 32.0–35.9)
MCV: 91 fL (ref 82–98)
NEUT%: 63 % (ref 40.0–80.0)
RDW: 12.6 % (ref 11.1–15.7)

## 2011-11-27 LAB — CMP (CANCER CENTER ONLY)
ALT(SGPT): 25 U/L (ref 10–47)
AST: 23 U/L (ref 11–38)
Creat: 0.7 mg/dl (ref 0.6–1.2)
Total Bilirubin: 1.2 mg/dl (ref 0.20–1.60)

## 2011-11-27 MED ORDER — IOHEXOL 300 MG/ML  SOLN
100.0000 mL | Freq: Once | INTRAMUSCULAR | Status: AC | PRN
  Administered 2011-11-27: 100 mL via INTRAVENOUS

## 2011-11-27 NOTE — Telephone Encounter (Signed)
Pt cx 12/04/11 apt and resch for 12/10/11

## 2011-11-28 ENCOUNTER — Telehealth: Payer: Self-pay | Admitting: *Deleted

## 2011-11-28 NOTE — Telephone Encounter (Signed)
Called patient to let him know that his Ct scan showed no recurrent testicular cancer per dr. Myna Hidalgo

## 2011-11-28 NOTE — Telephone Encounter (Signed)
Message copied by Anselm Jungling on Thu Nov 28, 2011 10:37 AM ------      Message from: Arlan Organ R      Created: Wed Nov 27, 2011 10:20 PM       Call -no recurrent testicular ca.  pete

## 2011-11-30 LAB — AFP TUMOR MARKER: AFP-Tumor Marker: 1.5 ng/mL (ref 0.0–8.0)

## 2011-11-30 LAB — LACTATE DEHYDROGENASE: LDH: 147 U/L (ref 94–250)

## 2011-12-04 ENCOUNTER — Ambulatory Visit: Payer: 59 | Admitting: Hematology & Oncology

## 2011-12-10 ENCOUNTER — Ambulatory Visit: Payer: 59 | Admitting: Hematology & Oncology

## 2012-02-26 ENCOUNTER — Telehealth: Payer: Self-pay | Admitting: Hematology & Oncology

## 2012-02-26 NOTE — Telephone Encounter (Signed)
Patient called and resch 12/10/11 missed appt for 02/27/12

## 2012-02-27 ENCOUNTER — Ambulatory Visit (HOSPITAL_BASED_OUTPATIENT_CLINIC_OR_DEPARTMENT_OTHER): Payer: 59 | Admitting: Hematology & Oncology

## 2012-02-27 VITALS — BP 138/78 | HR 61 | Temp 98.2°F | Wt 201.0 lb

## 2012-02-27 DIAGNOSIS — C6290 Malignant neoplasm of unspecified testis, unspecified whether descended or undescended: Secondary | ICD-10-CM

## 2012-02-27 NOTE — Progress Notes (Signed)
This office note has been dictated.

## 2012-02-28 NOTE — Progress Notes (Signed)
CC:   [name], M.D.  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  CURRENT THERAPY:  Observation.  INTERIM HISTORY:  Larry Hansen comes in for followup.  Shockingly enough, we last saw him actually in December.  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  He is going to school.  He will be graduating and going down to [place] for flight school.  His last scans for his testicular cancer were done back in early May. There was no evidence of recurrent disease.  His tumor markers have all been normal.  He feels well.  I see him in the gym working out.  He has been very vigorous.  He is eating well.  There is no pain.  There is no fatigue. He has had no rashes.  There have been no joint aches or pains.  PHYSICAL EXAMINATION:  General:  This is a well-developed, well- nourished white gentleman in no obvious distress.  Vital Signs:  Show a temperature of 98.2, pulse 61, respiratory rate 18, blood pressure 138/78, weight is 201.  Head and Neck Exam:  Shows a normocephalic, atraumatic skull.  There are no ocular or oral lesions.  There are no palpable cervical or supraclavicular lymph nodes.  Lungs:  Clear bilaterally.  Cardiac Exam:  Regular rate and rhythm with a normal S1 and S2.  There are no murmurs, rubs, or bruits.  Abdominal Exam:  Shows laparotomy scar that is well healed.  There is no fluid wave.  There is no palpable abdominal mass.  There is no palpable hepatosplenomegaly. Back Exam:  Shows no tenderness over the spine, ribs, or hips. Extremities:  Show no clubbing, cyanosis, or edema.  Neurological Exam: Shows no focal neurological deficits.  Skin Exam:  No rashes, ecchymosis, or petechia.  LABORATORY STUDIES:  Not done this visit.  IMPRESSION:  Mr. Hutchinson is a 21 year old gentleman with a history of recurrent nonseminomatous germ cell tumor of the left testicle.  He underwent chemotherapy with 4 cycles of  etoposide/platinum.  He completed this back in December 2010.  He then underwent retroperitoneal lymph node dissection in February 2011.  I really have to believe that he is cured.  I think his chance of recurrence is going to be less than 10%.  We will go ahead and plan for a followup CT scan on him in November.  I think we can go every 6 months at this point in time.  We will plan to see him back after his scan is done.    ______________________________ Josph Macho, M.D. PRE/MEDQ  D:  02/27/2012  T:  02/28/2012  Job:  [id number]

## 2012-05-29 ENCOUNTER — Other Ambulatory Visit (HOSPITAL_BASED_OUTPATIENT_CLINIC_OR_DEPARTMENT_OTHER): Payer: 59 | Admitting: Lab

## 2012-05-29 ENCOUNTER — Ambulatory Visit (HOSPITAL_BASED_OUTPATIENT_CLINIC_OR_DEPARTMENT_OTHER)
Admission: RE | Admit: 2012-05-29 | Discharge: 2012-05-29 | Disposition: A | Discharge status: Home / Self Care | Payer: 59 | Source: Ambulatory Visit | Attending: Hematology & Oncology | Admitting: Hematology & Oncology

## 2012-05-29 DIAGNOSIS — C6290 Malignant neoplasm of unspecified testis, unspecified whether descended or undescended: Secondary | ICD-10-CM | POA: Insufficient documentation

## 2012-05-29 LAB — CMP (CANCER CENTER ONLY)
ALT(SGPT): 22 U/L (ref 10–47)
CO2: 29 mEq/L (ref 18–33)
Creat: 0.7 mg/dl (ref 0.6–1.2)
Glucose, Bld: 98 mg/dL (ref 73–118)
Total Bilirubin: 1.4 mg/dl (ref 0.20–1.60)

## 2012-05-29 LAB — CBC WITH DIFFERENTIAL (CANCER CENTER ONLY)
BASO#: 0 10*3/uL (ref 0.0–0.2)
Eosinophils Absolute: 0.1 10*3/uL (ref 0.0–0.5)
HCT: 43 % (ref 38.7–49.9)
HGB: 15.4 g/dL (ref 13.0–17.1)
LYMPH#: 1.7 10*3/uL (ref 0.9–3.3)
MCH: 32.4 pg (ref 28.0–33.4)
MONO%: 10.2 % (ref 0.0–13.0)
NEUT#: 2.5 10*3/uL (ref 1.5–6.5)
NEUT%: 52.8 % (ref 40.0–80.0)
RBC: 4.75 10*6/uL (ref 4.20–5.70)

## 2012-05-29 MED ORDER — IOHEXOL 300 MG/ML  SOLN
100.0000 mL | Freq: Once | INTRAMUSCULAR | Status: AC | PRN
  Administered 2012-05-29: 100 mL via INTRAVENOUS

## 2012-06-01 LAB — BETA HCG QUANT (REF LAB): Beta hCG, Tumor Marker: <0.5 m[IU]/mL (ref <5.0)

## 2012-06-05 ENCOUNTER — Ambulatory Visit: Payer: 59 | Admitting: Hematology & Oncology

## 2012-06-18 ENCOUNTER — Ambulatory Visit (HOSPITAL_BASED_OUTPATIENT_CLINIC_OR_DEPARTMENT_OTHER): Payer: 59 | Admitting: Hematology & Oncology

## 2012-06-18 ENCOUNTER — Telehealth: Payer: Self-pay | Admitting: Hematology & Oncology

## 2012-06-18 VITALS — BP 106/67 | HR 58 | Temp 97.7°F | Resp 16 | Ht 75.0 in | Wt 203.0 lb

## 2012-06-18 DIAGNOSIS — C6290 Malignant neoplasm of unspecified testis, unspecified whether descended or undescended: Secondary | ICD-10-CM

## 2012-06-18 NOTE — Telephone Encounter (Signed)
Left pt message with 5-14 and 5-21 appointment times and to call for details.

## 2012-06-18 NOTE — Progress Notes (Signed)
This office note has been dictated.

## 2012-06-19 NOTE — Progress Notes (Signed)
CC:   Larry Hansen, M.D. Larry Hansen, M.D.  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  CURRENT THERAPY:  Observation.  INTERVAL HISTORY:  Larry Hansen comes in for a 50-month followup.  He is really doing great.  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  He is really looking forward to this.  Overall, he has had no issues.  There has been no abdominal pain.  He has had no back discomfort.  He has been working out.  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  We did go ahead and repeat his CT scan.  This was done on November 1st. CT scan did not show any evidence of recurrent disease in the chest/abdomen or pelvis.  His tumor markers have been done.  His LDH was 162.  Alpha fetoprotein was less than 1.3 and beta hCG was less than 0.5.  His appetite has been good.  He has had no cough.  He has had no rashes. There has been no leg swelling.  PHYSICAL EXAMINATION:  General:  This is a well-developed, well- nourished white gentleman in no obvious distress.  Vital signs: Temperature of 97.7, pulse 58, respiratory rate 16, blood pressure 106/67.  Weight is 203.  Head and neck:  Normocephalic, atraumatic skull.  There are no ocular or oral lesions.  There are no palpable cervical or supraclavicular lymph nodes.  Lungs:  Clear bilaterally. Cardiac:  Regular rate and rhythm with a normal S1 and S2.  There are no murmurs, rubs, or bruits.  Abdomen:  Soft with good bowel sounds.  There is no palpable abdominal mass.  There is no fluid wave.  There is no palpable hepatosplenomegaly.  He has well-healed laparotomy scar from his lymph node dissection.  Extremities:  No clubbing, cyanosis, or edema.  Skin:  No rashes, ecchymosis or petechia.  LABORATORY STUDIES:  White cell count is 4.7, hemoglobin 15.1, hematocrit 43, platelet count 169.  BUN is 8,  creatinine 0.7.  Calcium is 9 with an albumin of 4.1.  IMPRESSION:  Larry Hansen is a 21 year old gentleman with history of recurrent nonseminomatous germ cell tumor of the left testicle.  He completed 4 cycles of systemic chemotherapy with etoposide/cisplatin. This was completed back in December 2010.  He then underwent a retroperitoneal lymph node dissection in February 2011.  There was no residual disease noted at that time.  Again, I still feel that he is cured.  However, we still need to scan him every 6 months.  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    ______________________________ Larry Hansen, M.D. PRE/MEDQ  D:  06/18/2012  T:  06/19/2012  Job:  [id number]

## 2012-12-09 ENCOUNTER — Ambulatory Visit (HOSPITAL_BASED_OUTPATIENT_CLINIC_OR_DEPARTMENT_OTHER)
Admission: RE | Admit: 2012-12-09 | Discharge: 2012-12-09 | Disposition: A | Discharge status: Home / Self Care | Payer: 59 | Source: Ambulatory Visit | Attending: Hematology & Oncology | Admitting: Hematology & Oncology

## 2012-12-09 ENCOUNTER — Other Ambulatory Visit (HOSPITAL_BASED_OUTPATIENT_CLINIC_OR_DEPARTMENT_OTHER): Payer: 59 | Admitting: Lab

## 2012-12-09 ENCOUNTER — Encounter (HOSPITAL_BASED_OUTPATIENT_CLINIC_OR_DEPARTMENT_OTHER): Payer: Self-pay

## 2012-12-09 DIAGNOSIS — C6290 Malignant neoplasm of unspecified testis, unspecified whether descended or undescended: Secondary | ICD-10-CM

## 2012-12-09 DIAGNOSIS — Z09 Encounter for follow-up examination after completed treatment for conditions other than malignant neoplasm: Secondary | ICD-10-CM | POA: Insufficient documentation

## 2012-12-09 DIAGNOSIS — M948X9 Other specified disorders of cartilage, unspecified sites: Secondary | ICD-10-CM | POA: Insufficient documentation

## 2012-12-09 DIAGNOSIS — Z8547 Personal history of malignant neoplasm of testis: Secondary | ICD-10-CM | POA: Insufficient documentation

## 2012-12-09 LAB — CMP (CANCER CENTER ONLY)
BUN, Bld: 13 mg/dL (ref 7–22)
CO2: 26 mEq/L (ref 18–33)
Calcium: 9.1 mg/dL (ref 8.0–10.3)
Chloride: 100 mEq/L (ref 98–108)
Creat: 0.7 mg/dl (ref 0.6–1.2)
Total Bilirubin: 1.1 mg/dl (ref 0.20–1.60)

## 2012-12-09 LAB — CBC WITH DIFFERENTIAL (CANCER CENTER ONLY)
BASO#: 0 10*3/uL (ref 0.0–0.2)
Eosinophils Absolute: 0.1 10*3/uL (ref 0.0–0.5)
HCT: 46.2 % (ref 38.7–49.9)
HGB: 16.5 g/dL (ref 13.0–17.1)
MCH: 32.2 pg (ref 28.0–33.4)
MCV: 90 fL (ref 82–98)
MONO%: 7.3 % (ref 0.0–13.0)
NEUT#: 3.3 10*3/uL (ref 1.5–6.5)
RBC: 5.12 10*6/uL (ref 4.20–5.70)

## 2012-12-09 MED ORDER — IOHEXOL 300 MG/ML  SOLN
100.0000 mL | Freq: Once | INTRAMUSCULAR | Status: AC | PRN
  Administered 2012-12-09: 100 mL via INTRAVENOUS

## 2012-12-12 LAB — BETA HCG QUANT (REF LAB): Beta hCG, Tumor Marker: <0.5 m[IU]/mL (ref <5.0)

## 2012-12-16 ENCOUNTER — Ambulatory Visit: Payer: 59 | Admitting: Hematology & Oncology

## 2012-12-16 ENCOUNTER — Other Ambulatory Visit: Payer: 59 | Admitting: Lab

## 2013-01-04 ENCOUNTER — Telehealth: Payer: Self-pay | Admitting: Hematology & Oncology

## 2013-01-04 NOTE — Telephone Encounter (Signed)
Per MD I called pt to change the time of 6-10 appointment, pt said he needed to reschedule he is moving someone tommorrow and rescheduled for 01-21-13.

## 2013-01-05 ENCOUNTER — Ambulatory Visit: Payer: 59 | Admitting: Hematology & Oncology

## 2013-01-21 ENCOUNTER — Ambulatory Visit: Payer: 59 | Admitting: Hematology & Oncology

## 2013-01-21 ENCOUNTER — Telehealth: Payer: Self-pay | Admitting: Hematology & Oncology

## 2013-01-21 NOTE — Telephone Encounter (Signed)
Talked with patient he will call to schedule when he knows he can make appointment

## 2013-08-11 IMAGING — CT CT ABD-PELV W/ CM
3 of 8 series · 11 of 46 positions shown, 15 images · IV contrast (APPLIED)
Comparison: 05/29/2012

CT CHEST

CLINICAL DATA: Testicular cancer

CT CHEST, ABDOMEN AND PELVIS WITH CONTRAST
TECHNIQUE: Multidetector CT imaging of the chest, abdomen and
pelvis was performed following the standard protocol during bolus
administration of intravenous contrast.
Contrast: 100mL OMNIPAQUE IOHEXOL 300 MG/ML  SOLN

[Series 2: chest/abd/pel 5.0 b31f · axial · 0.75mm/px · z∈[-751,-301]mm · 6 of 150 slices shown]
[im 10/150  soft-tissue]
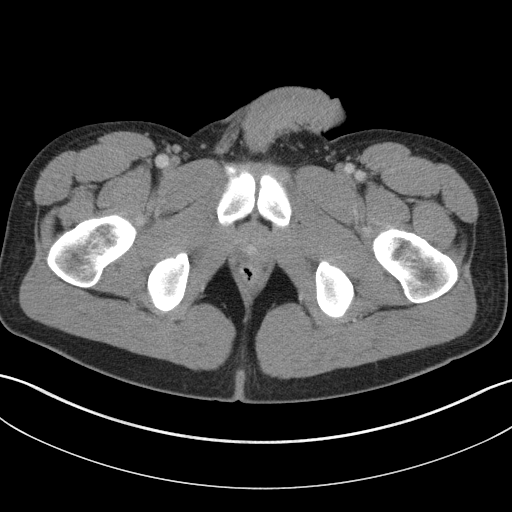
[im 30/150  soft-tissue]
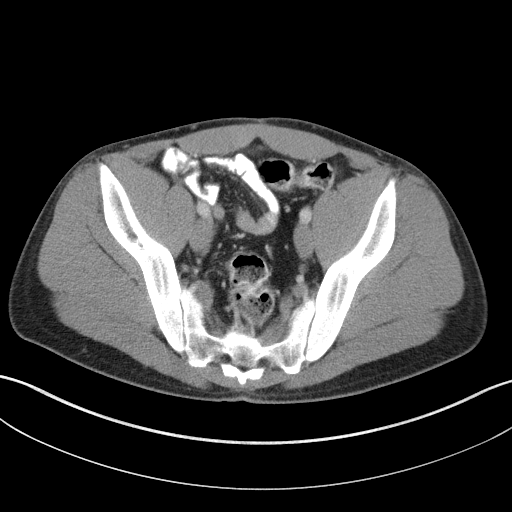
[im 50/150  soft-tissue]
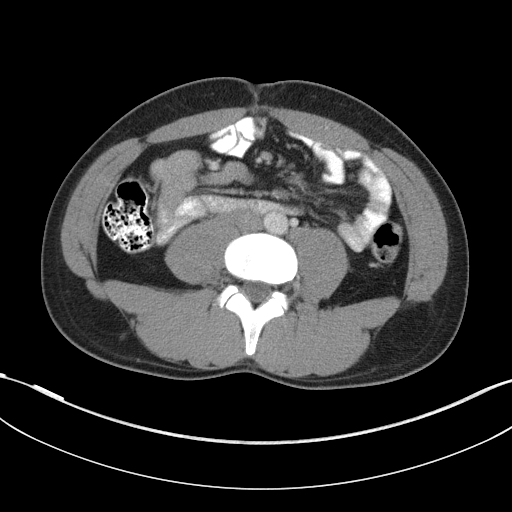
[im 70/150  soft-tissue]
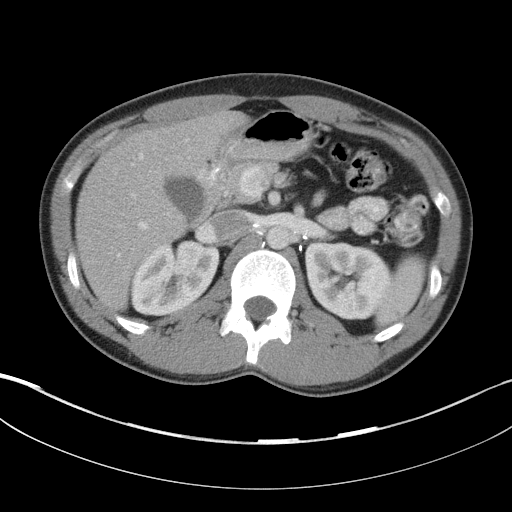
[im 80/150  soft-tissue]
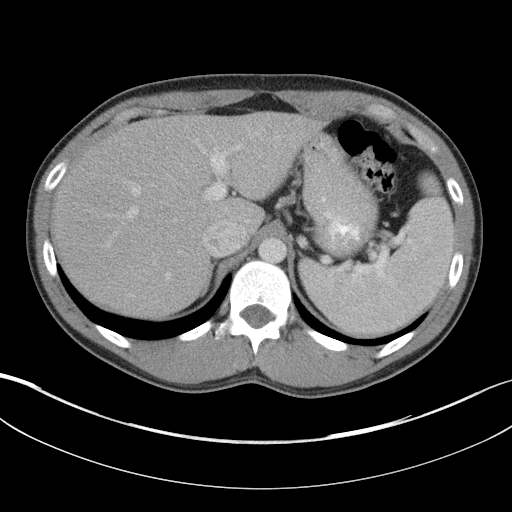
[im 100/150  soft-tissue]
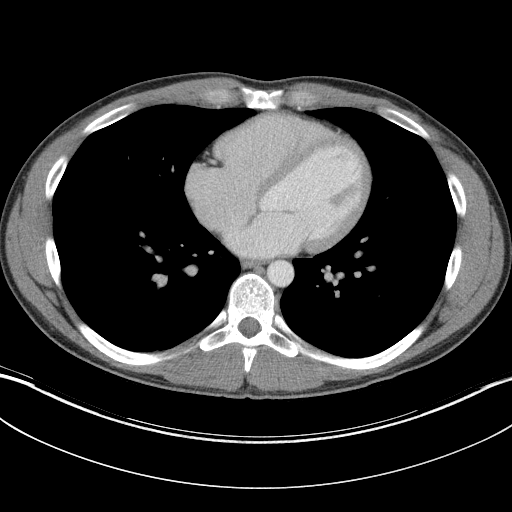

[Series 5: chest/abd/pel 3.0 coronal · coronal · 0.73mm/px · 3 of 82 slices shown, 4 images]
[im 21/82  soft-tissue]
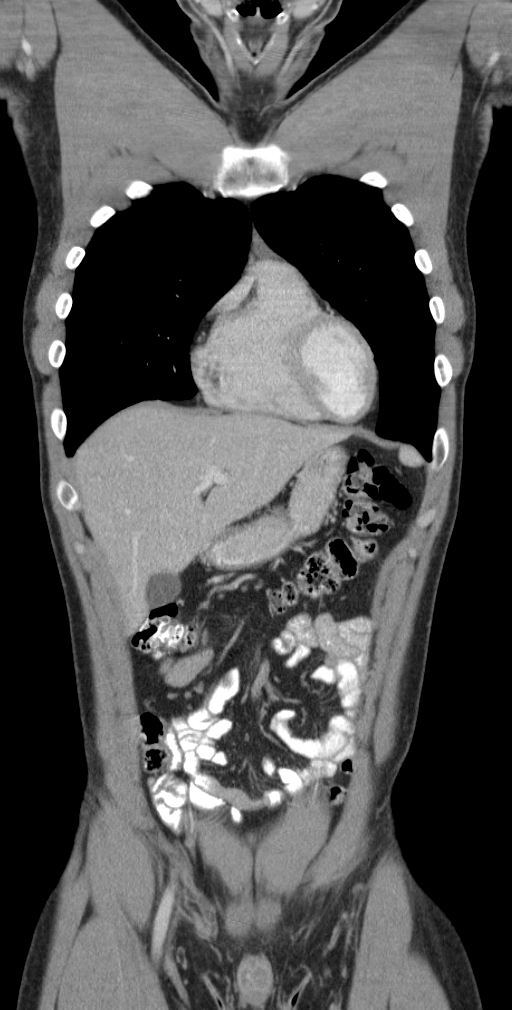
[im 41/82  soft-tissue]
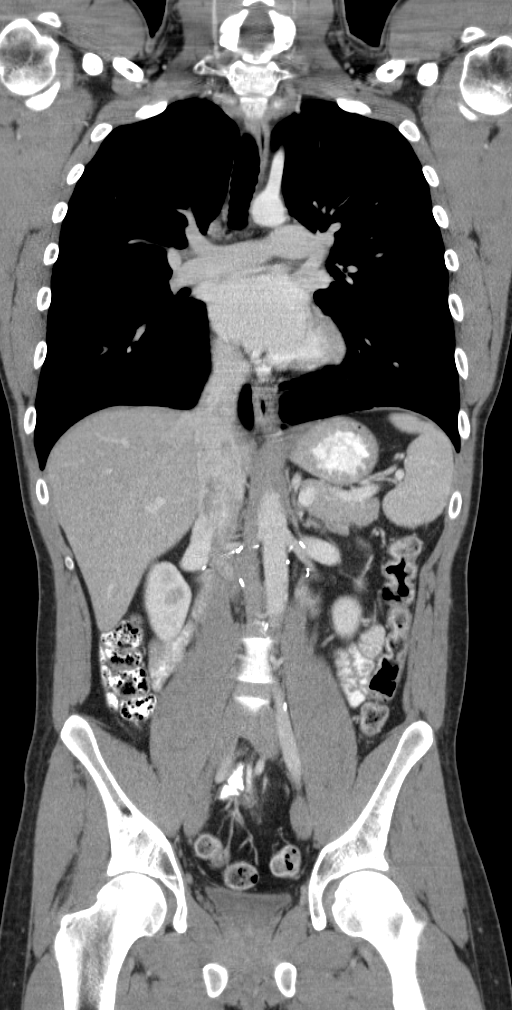
[im 41/82  bone]
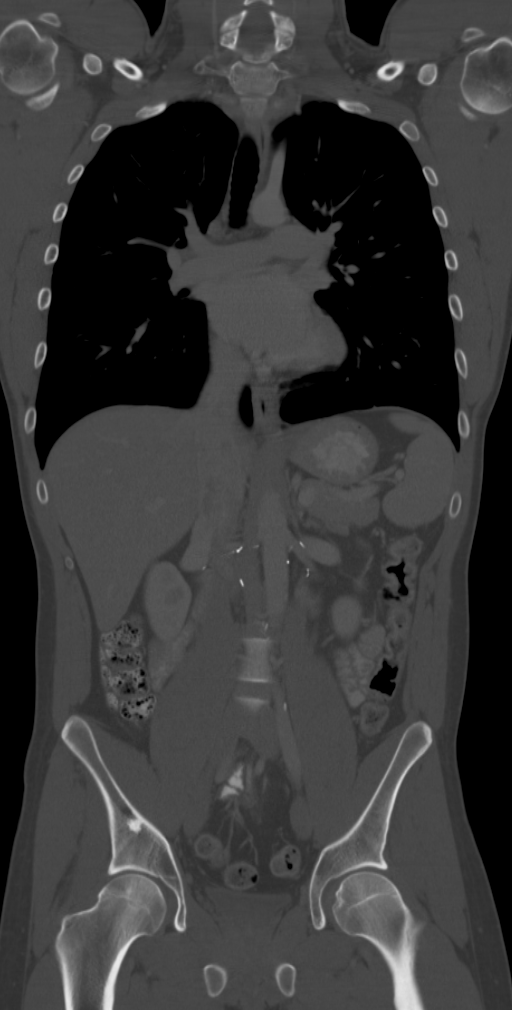
[im 61/82  soft-tissue]
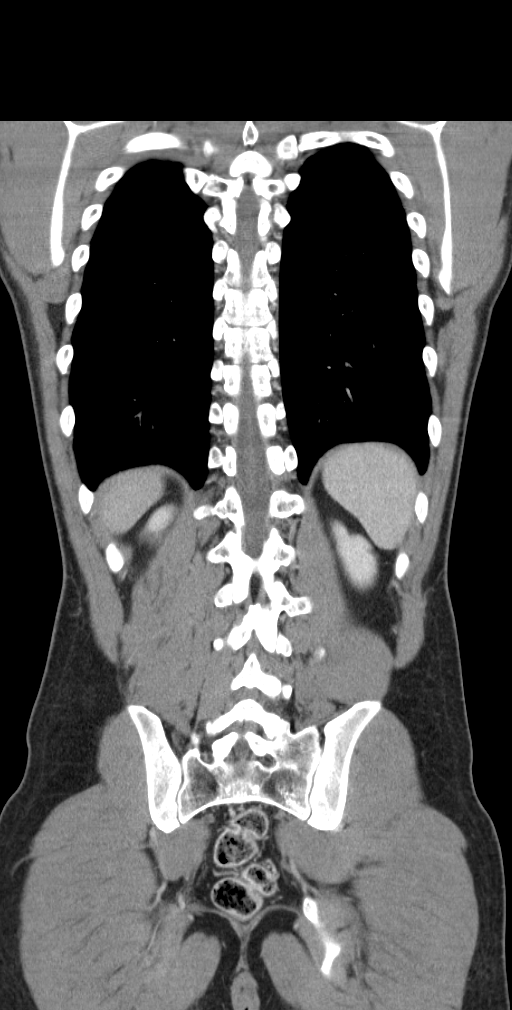

[Series 11: renal delay 5.0 b30f · axial · delayed · 0.72mm/px · z∈[-509,-454]mm · 2 of 33 slices shown, 5 images]
[im 11/33  soft-tissue]
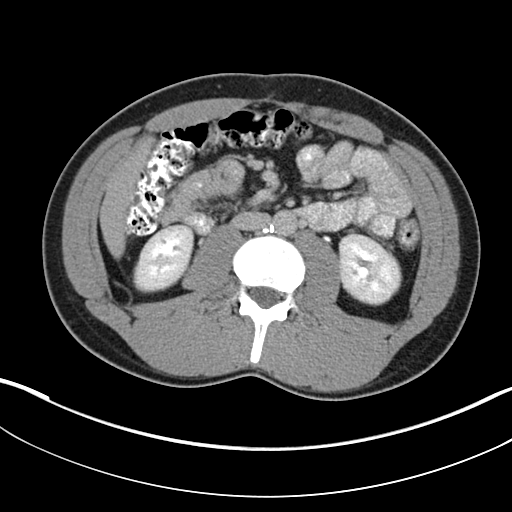
[im 11/33  lung]
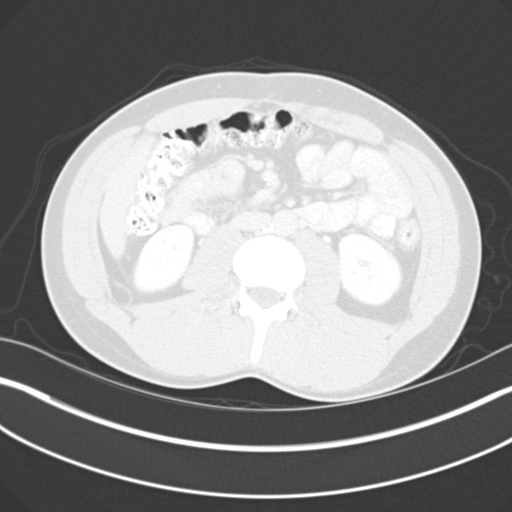
[im 11/33  bone]
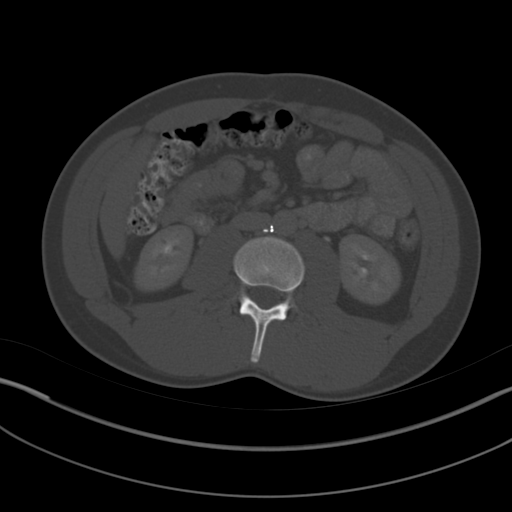
[im 22/33  soft-tissue]
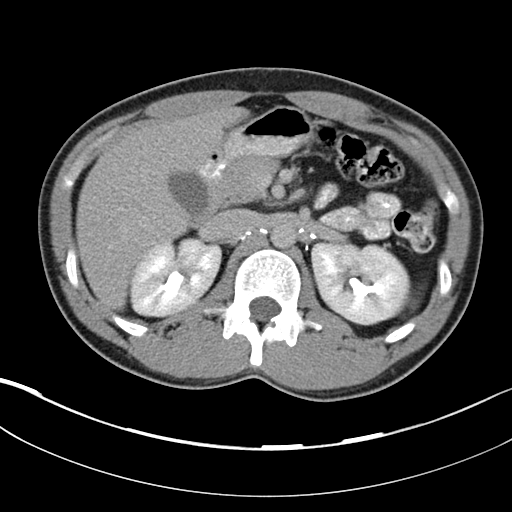
[im 22/33  lung]
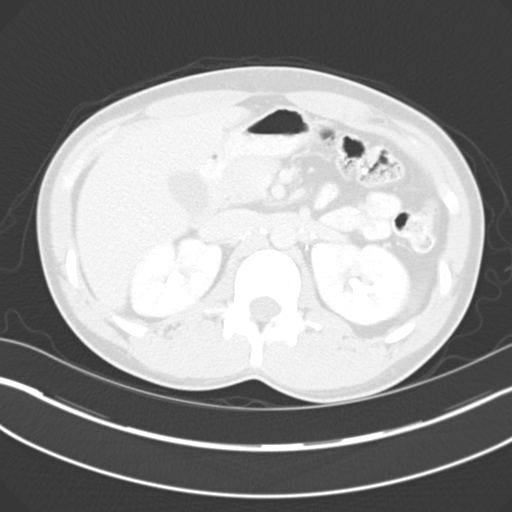

[11 of 46 positions shown; findings below may reference images not displayed]

FINDINGS: There is no axillary, supraclavicular, mediastinal, or
hilar lymphadenopathy.  As before and stable, thymic remnant is
identified in the anterior mediastinum.  Heart size is normal.  No
pericardial or pleural effusion.

Lungs are clear without parenchymal nodule or mass.

Bone windows reveal no worrisome lytic or sclerotic osseous
lesions.
IMPRESSION: Stable.  No evidence for metastatic disease in the chest.

CT ABDOMEN AND PELVIS
FINDINGS: No focal abnormalities seen in the liver or spleen.  The
stomach, duodenum, pancreas, and adrenal glands are unremarkable.
6 mm calcified stone noted in the gallbladder.  The kidneys have
normal imaging features bilaterally.

No abdominal aortic aneurysm.  Surgical clips in the
retroperitoneal space are compatible with previous lymph node
dissection.  There is no retroperitoneal lymphadenopathy.  No
intraperitoneal lymphadenopathy.  The abdominal bowel loops are
normal.

Imaging through the pelvis shows no free intraperitoneal fluid.
There is no pelvic sidewall lymphadenopathy.  No diverticular
change in the colon.  No colonic diverticulitis.  The terminal
ileum and the appendix are normal.

A left testicular prosthesis is evident.

Benign-appearing sclerotic focus in the right iliac bone is stable
back to 05/10/2009.  No worrisome lytic or sclerotic osseous
abnormality.
IMPRESSION: Stable exam.  No evidence for lymphadenopathy in the abdomen or
pelvis.

## 2019-12-09 ENCOUNTER — Telehealth: Payer: Self-pay | Admitting: Hematology & Oncology

## 2019-12-09 NOTE — Telephone Encounter (Signed)
Mailed medical records to: [address] [place], [address]  Records from 2010 to 2012 printed from [facility]. Release ID: [id number]
